# Patient Record
Sex: Male | Born: 2000 | Marital: Single | Smoking: Never smoker
Health system: Southern US, Community
[De-identification: ages and names within clinical notes are randomized; demographics above are authoritative.]

## PROBLEM LIST (undated history)

## (undated) DIAGNOSIS — S060X0A Concussion without loss of consciousness, initial encounter: Secondary | ICD-10-CM

## (undated) DIAGNOSIS — B079 Viral wart, unspecified: Secondary | ICD-10-CM

## (undated) DIAGNOSIS — T7840XA Allergy, unspecified, initial encounter: Secondary | ICD-10-CM

## (undated) HISTORY — PX: WISDOM TOOTH EXTRACTION: SHX21

## (undated) HISTORY — PX: HERNIA REPAIR: SHX51

## (undated) HISTORY — PX: HYDROCELE EXCISION / REPAIR: SUR1145

## (undated) HISTORY — DX: Allergy, unspecified, initial encounter: T78.40XA

## (undated) HISTORY — DX: Viral wart, unspecified: B07.9

## (undated) NOTE — Progress Notes (Signed)
Formatting of this note is different from the original.  Subjective:     Best contact phone number: [phone]     History was provided by the patient and grandmother.  Jason Liu is a 26 y.o. male here for evaluation of  Cough, congestion, sinus pressure, headache, ear pain for a week.     Review of Systems  Pertinent items are noted in HPI      Family History   Problem Relation Age of Onset   ? Hypertension Maternal Grandmother    ? Asthma Maternal Grandmother      History reviewed. No pertinent past medical history.     Past Surgical History:   Procedure Laterality Date   ? Circumcision       Pediatric History   Patient Guardian Status   ? Mother:  Leticia Clas   ? Father:  Sharol Roussel     Other Topics Concern   ? Not on file     Social History Narrative    No religious beliefs that affect medical care.    Speaks english     Objective:     BP 114/70   Temp 98 F (36.7 C) (Tympanic)   Resp (!) 24   Ht 5\' 9"  (1.753 m)   Wt 150 lb 6.4 oz (68.2 kg)   BMI 22.21 kg/m   Gen: Alert, non toxic, and well hydrated.  No signs of acute distress.  Head: Normocephalic.    Eyes: Extraocular movements intact.  Conjunctiva clear.  Ears:  Tympanic membranes clear.  Canals clear  Pharynx: No erythema or tonsillar hypertrophy  Neck: Full range of motion, no meningmus.  No lymphadenopathy  Respiratory:  Lungs clear to auscultation.  No use of accessory muscles.  Cardiovascular: Regular rate and rhythm.  No murmurs noted    Assessment:   Acute bacterial sinusitis     Plan:   No orders of the defined types were placed in this encounter.    See patient instructions for additional plan details.    Amox as directed     I have reviewed the information contained in this note and personally verified its accuracy.  I obtained the history of present illness and personally performed the physical exam   Electronically signed by Jacinto Reap, MD at 10/09/2017  1:03 AM EST

## (undated) NOTE — Telephone Encounter (Signed)
Formatting of this note might be different from the original.  Mychart msg sent. AS, CMA  Electronically signed by Sylvester Harder, CMA at 11/15/2022  9:11 AM EST

---
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
## 2019-07-30 ENCOUNTER — Other Ambulatory Visit: Payer: Self-pay

## 2019-07-30 DIAGNOSIS — Z20822 Contact with and (suspected) exposure to covid-19: Secondary | ICD-10-CM

## 2019-08-01 LAB — NOVEL CORONAVIRUS, NAA: SARS-CoV-2, NAA: NOT DETECTED

## 2021-09-07 ENCOUNTER — Emergency Department (HOSPITAL_COMMUNITY)
Admission: EM | Admit: 2021-09-07 | Discharge: 2021-09-07 | Disposition: A | Discharge status: Home / Self Care | Attending: Student | Admitting: Student

## 2021-09-07 ENCOUNTER — Other Ambulatory Visit: Payer: Self-pay

## 2021-09-07 DIAGNOSIS — Z20822 Contact with and (suspected) exposure to covid-19: Secondary | ICD-10-CM | POA: Diagnosis not present

## 2021-09-07 DIAGNOSIS — B349 Viral infection, unspecified: Secondary | ICD-10-CM | POA: Insufficient documentation

## 2021-09-07 DIAGNOSIS — R059 Cough, unspecified: Secondary | ICD-10-CM | POA: Diagnosis present

## 2021-09-07 LAB — RESP PANEL BY RT-PCR (FLU A&B, COVID) ARPGX2
Influenza A by PCR: POSITIVE — AB
Influenza B by PCR: NEGATIVE
SARS Coronavirus 2 by RT PCR: NEGATIVE

## 2021-09-07 MED ORDER — OSELTAMIVIR PHOSPHATE 75 MG PO CAPS: 75.0000 mg | ORAL_CAPSULE | Freq: Two times a day (BID) | ORAL | 0 refills | Status: AC
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
## 2021-09-07 NOTE — Discharge Instructions (Addendum)
You were seen in the emergency department today for viral illness symptoms.  You state that your sister has the flu.  It is likely that you also have the flu.  I have instructed you to follow-up with your MyChart for results of your swab.  Know that if you do not have the flu you likely have another environmental virus that will follow a similar course as the flu.  You may return to work as long as you have been fever free for 24 hours and feeling improved.  Please return to the emergency department if you begin to have fevers that are unresponsive to medication, shortness of breath or difficulty breathing.

## 2021-09-07 NOTE — ED Triage Notes (Signed)
Pt c/o flu-like symptoms since yesterday. Pt reports fever, vomiting, and body aches.

## 2021-09-07 NOTE — ED Notes (Signed)
Pt discharged from triage without difficulty. °

## 2021-12-17 ENCOUNTER — Inpatient Hospital Stay
Admit: 2021-12-17 | Discharge: 2021-12-17 | Disposition: A | Discharge status: Home / Self Care | Payer: TRICARE (CHAMPUS) | Attending: Emergency Medicine

## 2021-12-17 DIAGNOSIS — K529 Noninfective gastroenteritis and colitis, unspecified: Secondary | ICD-10-CM

## 2021-12-17 LAB — COVID-19 & INFLUENZA COMBO (LIAT HOSPITAL)
INFLUENZA A: NOT DETECTED
INFLUENZA B: NOT DETECTED
SARS-CoV-2: NOT DETECTED

## 2021-12-17 MED ORDER — IBUPROFEN 600 MG PO TABS
600 | ORAL_TABLET | Freq: Four times a day (QID) | ORAL | 0 refills | 15.00000 days | Status: DC | PRN
Start: 2021-12-17 — End: 2021-12-17

## 2021-12-17 MED ORDER — IBUPROFEN 800 MG PO TABS
800 MG | Freq: Once | ORAL | Status: AC
Start: 2021-12-17 — End: 2021-12-17
  Administered 2021-12-17: 14:00:00 800 mg via ORAL

## 2021-12-17 MED ORDER — ONDANSETRON 4 MG PO TBDP
4 MG | ORAL_TABLET | Freq: Three times a day (TID) | ORAL | 0 refills | Status: AC | PRN
Start: 2021-12-17 — End: 2021-12-22

## 2021-12-17 MED ORDER — DICYCLOMINE HCL 10 MG PO CAPS
10 MG | ORAL_CAPSULE | Freq: Four times a day (QID) | ORAL | 0 refills | Status: AC
Start: 2021-12-17 — End: 2021-12-24

## 2021-12-17 MED ORDER — ONDANSETRON 4 MG PO TBDP
4 MG | Freq: Once | ORAL | Status: AC
Start: 2021-12-17 — End: 2021-12-17
  Administered 2021-12-17: 14:00:00 4 mg via ORAL

## 2021-12-17 MED FILL — ONDANSETRON 4 MG PO TBDP: 4 MG | ORAL | Qty: 1

## 2021-12-17 MED FILL — IBUPROFEN 800 MG PO TABS: 800 MG | ORAL | Qty: 1

## 2021-12-17 NOTE — ED Provider Notes (Signed)
[facility] EMERGENCY DEPT  EMERGENCY DEPARTMENT ENCOUNTER      Pt Name: Jim Wood  MRN: [id number]  Birthdate 2001/10/08  Date of evaluation: 12/17/2021  Provider: Brendolyn Patty, MD    CHIEF COMPLAINT       Chief Complaint   Patient presents with    Generalized Body Aches     Body aches, headache, vomiting  since yesterday         HISTORY OF PRESENT ILLNESS    HPI  21 y.o. male presenting with complaint of generalized body aches, headache, vomiting and diarrhea.  Patient arrives with stable vital signs.  He has had the symptoms for the past 24 hours.  He reports that he is otherwise healthy with no major medical issues.  Reports no significant abdominal discomfort.  Denies any other complaints or concerns.      Nursing Notes were reviewed.    REVIEW OF SYSTEMS       Review of Systems   Constitutional:  Negative for chills, fatigue and fever.   HENT:  Negative for congestion and sore throat.    Respiratory:  Negative for cough, shortness of breath and wheezing.    Cardiovascular:  Negative for chest pain and palpitations.   Gastrointestinal:  Positive for diarrhea, nausea and vomiting. Negative for abdominal distention, abdominal pain and constipation.   Genitourinary:  Negative for dysuria and hematuria.   Musculoskeletal:  Negative for joint swelling and myalgias.   Skin:  Negative for rash and wound.   Neurological:  Negative for dizziness and headaches.   Psychiatric/Behavioral:  Negative for agitation. The patient is not nervous/anxious.    All other systems reviewed and are negative.    Except as noted above the remainder of the review of systems was reviewed and negative.       PAST MEDICAL HISTORY   No past medical history on file.    SURGICAL HISTORY     No past surgical history on file.    CURRENT MEDICATIONS       Previous Medications    No medications on file       ALLERGIES     Patient has no known allergies.    FAMILY HISTORY     No family history on file.     SOCIAL HISTORY       Social History      Socioeconomic History    Marital status: Single       SCREENINGS         Glasgow Coma Scale  Eye Opening: Spontaneous  Best Motor Response: Obeys commands                     CIWA Assessment  BP: 122/75  Heart Rate: 98                 PHYSICAL EXAM    (up to 7 for level 4, 8 or more for level 5)     ED Triage Vitals [12/17/21 0837]   BP Temp Temp Source Heart Rate Resp SpO2 Height Weight   122/75 98.8 ??F (37.1 ??C) Oral 98 15 98 % 5\' 10"  (1.778 m) 185 lb (83.9 kg)       Physical Exam  Vitals and nursing note reviewed.   Constitutional:       Appearance: Normal appearance.   HENT:      Head: Normocephalic and atraumatic.      Nose: Nose normal.      Mouth/Throat:  Mouth: Mucous membranes are moist.      Pharynx: Oropharynx is clear.   Eyes:      Conjunctiva/sclera: Conjunctivae normal.      Pupils: Pupils are equal, round, and reactive to light.   Cardiovascular:      Rate and Rhythm: Normal rate and regular rhythm.   Pulmonary:      Effort: Pulmonary effort is normal.      Breath sounds: Normal breath sounds.   Musculoskeletal:         General: No swelling or deformity. Normal range of motion.      Cervical back: Normal range of motion and neck supple.   Skin:     General: Skin is warm and dry.   Neurological:      General: No focal deficit present.      Mental Status: He is alert. Mental status is at baseline.      Motor: No weakness.   Psychiatric:         Mood and Affect: Mood normal.         Behavior: Behavior normal.       DIAGNOSTIC RESULTS   PROCEDURES:  Unless otherwise noted below, none     Procedures    EKG: All EKG's are interpreted by the Emergency Department Physician who either signs or Co-signs this chart in the absence of a cardiologist.      RADIOLOGY:   Non-plain film images such as CT, Ultrasound and MRI are read by the radiologist. Plain radiographic images are visualized and preliminarily interpreted by the emergency physician with the below findings:    Interpretation per the Radiologist  below, if available at the time of this note:    No orders to display       ED BEDSIDE ULTRASOUND:   Performed by ED Physician - none    LABS:  Labs Reviewed   COVID-19 & INFLUENZA COMBO [facility])    Narrative:     Is this test for diagnosis or screening?->Diagnosis of ill patient  Symptomatic for COVID-19 as defined by CDC?->Unknown  Date of Symptom Onset->N/A  Hospitalized for COVID-19?->No  Admitted to ICU for COVID-19?->No  Pregnant:->No       All other labs were within normal range or not returned as of this dictation.    EMERGENCY DEPARTMENT COURSE/REASSESSMENT and MDM:   Vitals:    Vitals:    12/17/21 0837   BP: 122/75   Pulse: 98   Resp: 15   Temp: 98.8 ??F (37.1 ??C)   TempSrc: Oral   SpO2: 98%   Weight: 83.9 kg   Height: 5\' 10"  (1.778 m)       ED Course:    ED Course as of 12/17/21 1022   Fri Dec 17, 2021   1014 Patient is negative for flu and COVID.  His vital signs are stable.  He can tolerate p.o. therapy I will provide symptomatic treatment for home for I believe there is a viral enteritis.  I did asked that he return for any worsening symptoms or if his pain begins to localize to any particular quadrant of his abdomen.  Patient was agreeable with this and otherwise comfortable with discharge home. [BB]      ED Course User Index  [BB] Dec 19, 2021, MD       MDM     Amount and/or Complexity of Data Reviewed  Clinical lab tests: ordered and reviewed  Decide to obtain previous medical records or to obtain history  from someone other than the patient: yes  Review and summarize past medical records: yes  Independent visualization of images, tracings, or specimens: yes    Risk of Complications, Morbidity, and/or Mortality  Presenting problems: low  Diagnostic procedures: low  Management options: low    Patient Progress  Patient progress: improved      CONSULTS:  None    FINAL IMPRESSION      1. Enteritis          DISPOSITION/PLAN   DISPOSITION Decision To Discharge 12/17/2021 08:43:08  AM      PATIENT REFERRED TO:  [facility]  Please return to the emergency room with any acute questions, concerns, worsening status or any delay to your outpatient care.       You may also call [phone] for referral to physicians in your area.  Call today      DISCHARGE MEDICATIONS:  New Prescriptions    DICYCLOMINE (BENTYL) 10 MG CAPSULE    Take 1 capsule by mouth 4 times daily for 7 days    ONDANSETRON (ZOFRAN-ODT) 4 MG DISINTEGRATING TABLET    Take 1 tablet by mouth every 8 hours as needed for Nausea or Vomiting     Controlled Substances Monitoring:     No flowsheet data found.    (Please note that portions of this note were completed with a voice recognition program.  Efforts were made to edit the dictations but occasionally words are mis-transcribed.)    Brendolyn Patty, MD (electronically signed)  Attending Emergency Physician           Brendolyn Patty, MD  12/17/21 1022

## 2021-12-17 NOTE — Discharge Instructions (Addendum)
Please return to the emergency room with any acute questions, concerns, worsening status or any delay to your outpatient care.    Thank you so much for visiting with us today.  You have had a screening emergency medical exam and to this point, no emergent condition has been identified.  It is important to realize that of course your visit today is simply a moment in time and that your medical condition can certainly change and become worse, necessitating re-evaluation in an urgent or emergent manner.  It is your responsibility to seek care in such instances.  It is also your responsibility to follow up with your primary care provider to discuss your Emergency Department visit and to go over any and all testing that was incurred during your Emergency Department visit.   We have made you aware of any pertinent results at this visit, however this may not have been comprehensive.  Lack of an acute emergency condition does not mean that there is not a problem, nor does it replace a thorough exam performed by a primary care physician.  It is your responsibility to follow your discharge instructions as given, to follow up with any other physicians as indicated, and to return to the Emergency Department as instructed.  Thank you again for visiting with us today, please let us know if you have any further questions.     You may also call [phone] for referral to physicians in your area.

## 2022-07-04 ENCOUNTER — Emergency Department: Admit: 2022-07-05 | Payer: TRICARE (CHAMPUS)

## 2022-07-04 DIAGNOSIS — S161XXA Strain of muscle, fascia and tendon at neck level, initial encounter: Secondary | ICD-10-CM
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
## 2022-07-05 ENCOUNTER — Inpatient Hospital Stay
Admit: 2022-07-05 | Discharge: 2022-07-05 | Disposition: A | Discharge status: Home / Self Care | Payer: TRICARE (CHAMPUS)

## 2022-07-05 MED ORDER — NAPROXEN SODIUM 550 MG PO TABS
550 MG | ORAL_TABLET | Freq: Two times a day (BID) | ORAL | 0 refills | Status: AC
Start: 2022-07-05 — End: 2022-07-11

## 2022-07-05 MED ORDER — METHOCARBAMOL 500 MG PO TABS
500 MG | ORAL_TABLET | Freq: Three times a day (TID) | ORAL | 0 refills | Status: AC
Start: 2022-07-05 — End: 2022-07-09

## 2022-07-07 NOTE — Telephone Encounter (Signed)
ED FOLLOW UP PROJECT -Patient seen in ER on 07/04/22 and is followed up the [place] for his care

## 2022-09-16 DIAGNOSIS — Z419 Encounter for procedure for purposes other than remedying health state, unspecified: Secondary | ICD-10-CM | POA: Diagnosis not present

## 2022-10-17 DIAGNOSIS — Z419 Encounter for procedure for purposes other than remedying health state, unspecified: Secondary | ICD-10-CM | POA: Diagnosis not present

## 2022-11-15 ENCOUNTER — Telehealth: Payer: Self-pay

## 2022-11-15 NOTE — Telephone Encounter (Signed)
Mychart msg sent. AS, CMA 

## 2022-11-17 DIAGNOSIS — Z419 Encounter for procedure for purposes other than remedying health state, unspecified: Secondary | ICD-10-CM | POA: Diagnosis not present

## 2022-12-16 DIAGNOSIS — Z419 Encounter for procedure for purposes other than remedying health state, unspecified: Secondary | ICD-10-CM | POA: Diagnosis not present

## 2023-01-16 DIAGNOSIS — Z419 Encounter for procedure for purposes other than remedying health state, unspecified: Secondary | ICD-10-CM | POA: Diagnosis not present

## 2023-02-15 DIAGNOSIS — Z419 Encounter for procedure for purposes other than remedying health state, unspecified: Secondary | ICD-10-CM | POA: Diagnosis not present

## 2023-03-18 DIAGNOSIS — Z419 Encounter for procedure for purposes other than remedying health state, unspecified: Secondary | ICD-10-CM | POA: Diagnosis not present

## 2023-03-23 ENCOUNTER — Emergency Department
Admission: EM | Admit: 2023-03-23 | Discharge: 2023-03-24 | Disposition: A | Discharge status: Home / Self Care | Payer: TRICARE Prime—HMO | Attending: Internal Medicine | Admitting: Internal Medicine

## 2023-03-23 DIAGNOSIS — R1110 Vomiting, unspecified: Secondary | ICD-10-CM | POA: Insufficient documentation

## 2023-03-23 DIAGNOSIS — R197 Diarrhea, unspecified: Secondary | ICD-10-CM | POA: Insufficient documentation

## 2023-03-23 DIAGNOSIS — R109 Unspecified abdominal pain: Secondary | ICD-10-CM

## 2023-03-23 LAB — CBC AND DIFFERENTIAL
Absolute NRBC: 0 10*3/uL (ref 0.00–0.00)
Basophils Absolute Automated: 0.04 10*3/uL (ref 0.00–0.08)
Basophils Automated: 0.7 %
Eosinophils Absolute Automated: 0.25 10*3/uL (ref 0.00–0.44)
Eosinophils Automated: 4.6 %
Hematocrit: 47.2 % (ref 37.6–49.6)
Hgb: 15.9 g/dL (ref 12.5–17.1)
Immature Granulocytes Absolute: 0.01 10*3/uL (ref 0.00–0.07)
Immature Granulocytes: 0.2 %
Instrument Absolute Neutrophil Count: 3.03 10*3/uL (ref 1.10–6.33)
Lymphocytes Absolute Automated: 1.77 10*3/uL (ref 0.42–3.22)
Lymphocytes Automated: 32.4 %
MCH: 29.3 pg (ref 25.1–33.5)
MCHC: 33.7 g/dL (ref 31.5–35.8)
MCV: 87.1 fL (ref 78.0–96.0)
MPV: 9.6 fL (ref 8.9–12.5)
Monocytes Absolute Automated: 0.36 10*3/uL (ref 0.21–0.85)
Monocytes: 6.6 %
Neutrophils Absolute: 3.03 10*3/uL (ref 1.10–6.33)
Neutrophils: 55.5 %
Nucleated RBC: 0 /100 WBC (ref 0.0–0.0)
Platelets: 225 10*3/uL (ref 142–346)
RBC: 5.42 10*6/uL (ref 4.20–5.90)
RDW: 12 % (ref 11–15)
WBC: 5.46 10*3/uL (ref 3.10–9.50)

## 2023-03-23 LAB — LIPASE: Lipase: 24 U/L (ref 8–78)

## 2023-03-23 LAB — URINALYSIS WITH REFLEX TO MICROSCOPIC EXAM - REFLEX TO CULTURE
Bilirubin, UA: NEGATIVE
Blood, UA: NEGATIVE
Glucose, UA: NEGATIVE
Ketones UA: NEGATIVE
Leukocyte Esterase, UA: NEGATIVE
Nitrite, UA: NEGATIVE
Specific Gravity UA: 1.023 (ref 1.001–1.035)
Urine pH: 6.5 (ref 5.0–8.0)
Urobilinogen, UA: NORMAL mg/dL (ref 0.2–2.0)

## 2023-03-23 LAB — COMPREHENSIVE METABOLIC PANEL
ALT: 92 U/L — ABNORMAL HIGH (ref 0–55)
AST (SGOT): 41 U/L (ref 5–41)
Albumin/Globulin Ratio: 1.6 (ref 0.9–2.2)
Albumin: 4.4 g/dL (ref 3.5–5.0)
Alkaline Phosphatase: 73 U/L (ref 37–117)
Anion Gap: 7 (ref 5.0–15.0)
BUN: 9 mg/dL (ref 9.0–28.0)
Bilirubin, Total: 0.8 mg/dL (ref 0.2–1.2)
CO2: 29 mEq/L (ref 17–29)
Calcium: 9.3 mg/dL (ref 8.5–10.5)
Chloride: 104 mEq/L (ref 99–111)
Creatinine: 1.2 mg/dL (ref 0.5–1.5)
Globulin: 2.7 g/dL (ref 2.0–3.6)
Glucose: 87 mg/dL (ref 70–100)
Potassium: 4.3 mEq/L (ref 3.5–5.3)
Protein, Total: 7.1 g/dL (ref 6.0–8.3)
Sodium: 140 mEq/L (ref 135–145)
eGFR: >60 mL/min/{1.73_m2} (ref >=60)

## 2023-03-23 MED ORDER — SODIUM CHLORIDE 0.9 % IV BOLUS
1000.0000 mL | Freq: Once | INTRAVENOUS | Status: AC
Start: 2023-03-23 — End: 2023-03-24
  Administered 2023-03-23: 1000 mL via INTRAVENOUS

## 2023-03-23 MED ORDER — FAMOTIDINE 10 MG/ML IV SOLN (WRAP)
20.0000 mg | Freq: Once | INTRAVENOUS | Status: AC
Start: 2023-03-23 — End: 2023-03-23
  Administered 2023-03-23: 20 mg via INTRAVENOUS
  Filled 2023-03-23: qty 2

## 2023-03-23 MED ORDER — ONDANSETRON HCL 4 MG/2ML IJ SOLN
4.0000 mg | Freq: Once | INTRAMUSCULAR | Status: AC
Start: 2023-03-23 — End: 2023-03-23
  Administered 2023-03-23: 4 mg via INTRAVENOUS
  Filled 2023-03-23: qty 2

## 2023-03-23 NOTE — ED Triage Notes (Signed)
[facility]PART[facility]  Provider in Triage Note        Patient Name: Jason Liu    Chief Complaint:   Chief Complaint   Patient presents with    Dizziness    Emesis    Diarrhea       HPI: Jason Liu is a 22 y.o. male, who has had a rapid medical screening evaluation initiated by myself. Presents to the ED c/o abdominal pain, nausea, vomiting, diarrhea that began at 2 PM today. He states he is in the [employer] and there is a stomach bug going around, and there are other coworkers with similar symptoms. Denies fever, chills, cp, sob.     States feels like he is not able to stay hydrated, cannot keep down fluids    Medical/Surgical/Social history: as per HPI    Vitals: BP 135/66   Pulse 65   Temp 97.9 F (36.6 C) (Oral)   Resp 20   Wt 93 kg   SpO2 100%     Pertinent brief exam:   Examination of area of concern: abdomen soft, nondistended    Preliminary orders: labs    Symptom based preliminary diagnosis/MDM: abdominal pain, n/v/d    Patient advised to remain in the ED until further evaluation can be performed. Patient instructed to notify staff of any changes in condition while waiting.  This assessment is an initial evaluation to expedite care.

## 2023-03-23 NOTE — ED Triage Notes (Signed)
Pt with c/o lightheadedness that began today. Pt states N/V/D, chills, HA. Pt states seeing spots of blood in emesis, periumbilical pain. Pt denies fevers, CP, SOB, visual changes. Pt denies being on blood thinners. Pt states taking Ibuprofen at times. Pt states pain is 6/10. Pt denies taking any medication prior to arrival.

## 2023-03-23 NOTE — ED Provider Notes (Signed)
EMERGENCY DEPARTMENT HISTORY AND PHYSICAL EXAM    Date: 03/23/2023  Patient Name: Jason Liu    History of Presenting Illness     Chief Complaint   Patient presents with    Dizziness    Emesis    Diarrhea       History Provided By: pt    Chief Complaint:   Onset:   Timing:   Location:   Quality:   Severity:   Modifying Factors:   Associated Symptoms:     Additional History: Jason Liu is a 22 y.o. male who says he woke up from sleeping this afternoon (he works nights) and had nausea and vomiting about 7-8 times (with a few specs of blood) and two episodes of watery non-bloody diarrhea. Yesterday he had a headache, sore throat, myalgias. He does not think he has had a fever. He denies chest pain or shortness of breath. He is concerned about being dehydrated. He feels minimal cental abdominal discomfort.     PCP: Pcp, None, MD      No current facility-administered medications for this encounter.     Current Outpatient Medications   Medication Sig Dispense Refill    ondansetron (ZOFRAN-ODT) 4 MG disintegrating tablet Take 1 tablet (4 mg) by mouth every 6 (six) hours as needed for Nausea 8 tablet 0       Past History     Past Medical History:  History reviewed. No pertinent past medical history.    Past Surgical History:  Past Surgical History:   Procedure Laterality Date    WISDOM TOOTH EXTRACTION         Family History:  History reviewed. No pertinent family history.    Social History:  Social History     Tobacco Use    Smoking status: Never    Smokeless tobacco: Never   Vaping Use    Vaping status: Every Day   Substance Use Topics    Alcohol use: Yes    Drug use: Never       Allergies:  No Known Allergies    Review of Systems   Review of Systems     Physical Exam   BP 118/65   Pulse 64   Temp 97.9 F (36.6 C) (Oral)   Resp 16   Wt 93 kg   SpO2 99%   Physical Exam  Vitals reviewed.   Constitutional:       Appearance: He is not toxic-appearing or diaphoretic.   HENT:      Head: Normocephalic and  atraumatic.      Nose: No congestion or rhinorrhea.   Eyes:      Conjunctiva/sclera: Conjunctivae normal.   Cardiovascular:      Rate and Rhythm: Normal rate and regular rhythm.   Pulmonary:      Effort: Pulmonary effort is normal.      Breath sounds: Normal breath sounds.   Abdominal:      General: Bowel sounds are normal.      Palpations: Abdomen is soft.      Tenderness: There is no abdominal tenderness.   Musculoskeletal:      Cervical back: Neck supple.      Right lower leg: No edema.      Left lower leg: No edema.   Skin:     General: Skin is warm and dry.   Neurological:      Mental Status: He is alert and oriented to person, place, and time.   Psychiatric:  Mood and Affect: Mood normal.         Behavior: Behavior normal.         Thought Content: Thought content normal.         Judgment: Judgment normal.         Diagnostic Study Results     Labs -     Results       Procedure Component Value Units Date/Time    COVID-19 (SARS-CoV-2) and Influenza A/B, NAA ([facility]) [[id number]] Collected: 03/23/23 2335    Specimen: Nasopharyngeal Updated: 03/24/23 0008     Purpose of COVID testing Diagnostic -[facility]     SARS-CoV-2 Specimen Source Nasal Swab     SARS-CoV-2 Overall Result Not Detected     Influenza A Not Detected     Influenza B Not Detected    Narrative:      o Collect and clearly label specimen type:  o PREFERRED-Upper respiratory specimen: One Nasal Swab in  Transport Media.  o Hand deliver to laboratory ASAP  Diagnostic -[facility]I    Urinalysis Reflex to Microscopic Exam- Reflex to Culture [[id number]]  (Abnormal) Collected: 03/23/23 2335    Specimen: Urine, Clean Catch Updated: 03/23/23 2354     Urine Type Urine, Clean Ca     Color, UA Yellow     Clarity, UA Clear     Specific Gravity UA 1.023     Urine pH 6.5     Leukocyte Esterase, UA Negative     Nitrite, UA Negative     Protein, UR 10= Trace     Glucose, UA Negative     Ketones UA Negative     Urobilinogen, UA Normal mg/dL      Bilirubin, UA Negative      Blood, UA Negative     RBC, UA 0-2 /hpf      WBC, UA 0-5 /hpf      Squamous Epithelial Cells, Urine 0-5 /hpf      Urine Mucus Present    Comprehensive metabolic panel [[id number]]  (Abnormal) Collected: 03/23/23 2022    Specimen: Blood Updated: 03/23/23 2107     Glucose 87 mg/dL      BUN 9.0 mg/dL      Creatinine 1.2 mg/dL      Sodium 086 mEq/L      Potassium 4.3 mEq/L      Chloride 104 mEq/L      CO2 29 mEq/L      Calcium 9.3 mg/dL      Protein, Total 7.1 g/dL      Albumin 4.4 g/dL      AST (SGOT) 41 U/L      ALT 92 U/L      Alkaline Phosphatase 73 U/L      Bilirubin, Total 0.8 mg/dL      Globulin 2.7 g/dL      Albumin/Globulin Ratio 1.6     Anion Gap 7.0     eGFR >60.0 mL/min/1.73 m2     Lipase [[id number]] Collected: 03/23/23 2022    Specimen: Blood Updated: 03/23/23 2107     Lipase 24 U/L     CBC and differential [[id number]] Collected: 03/23/23 2022    Specimen: Blood Updated: 03/23/23 2052     WBC 5.46 x10 3/uL      Hgb 15.9 g/dL      Hematocrit 01.0 %      Platelets 225 x10 3/uL      RBC 5.42 x10 6/uL      MCV 87.1 fL  MCH 29.3 pg      MCHC 33.7 g/dL      RDW 12 %      MPV 9.6 fL      Instrument Absolute Neutrophil Count 3.03 x10 3/uL      Neutrophils 55.5 %      Lymphocytes Automated 32.4 %      Monocytes 6.6 %      Eosinophils Automated 4.6 %      Basophils Automated 0.7 %      Immature Granulocytes 0.2 %      Nucleated RBC 0.0 /100 WBC      Neutrophils Absolute 3.03 x10 3/uL      Lymphocytes Absolute Automated 1.77 x10 3/uL      Monocytes Absolute Automated 0.36 x10 3/uL      Eosinophils Absolute Automated 0.25 x10 3/uL      Basophils Absolute Automated 0.04 x10 3/uL      Immature Granulocytes Absolute 0.01 x10 3/uL      Absolute NRBC 0.00 x10 3/uL             Radiologic Studies -   Radiology Results (24 Hour)       ** No results found for the last 24 hours. **        .      Medical Decision Making   I am the first provider for this patient.    Vital Signs-Reviewed the patient's vital signs.     Pulse  Oximetry Analysis - Normal 100 % on ra    ED Course: Medical Decision Making  Amount and/or Complexity of Data Reviewed  External Data Reviewed: notes.  Labs: ordered. Decision-making details documented in ED Course.     Details: Cbc is normal, alt 92, rest of comp chem is norma except for alt 92l, lipase is normal, ua is negative for infection , negative covid/flu     Risk  OTC drugs.  Prescription drug management.  Decision regarding hospitalization.  Risk Details: Pt was given iv pepcid and zofran and normal saline. Pt felt much better and ready for discharge. Abdomen is soft and non-tender. Rted precautions given.          Diagnosis     Clinical Impression:   1. Abdominal pain, vomiting, and diarrhea        _______________________________    Attestations:  This note is prepared by Avanell Shackleton, MD.     Avanell Shackleton, MD.  I confirm that the note above accurately reflects all work, treatment, procedures, and medical decision making performed by me.    _______________________________         Azzie Glatter, MD  03/28/23 [phone]

## 2023-03-24 LAB — COVID-19 (SARS-COV-2) & INFLUENZA  A/B, NAA (ROCHE LIAT)
Influenza A: NOT DETECTED
Influenza B: NOT DETECTED
SARS-CoV-2 Overall Result: NOT DETECTED

## 2023-03-24 MED ORDER — ONDANSETRON 4 MG PO TBDP
4.0000 mg | ORAL_TABLET | Freq: Four times a day (QID) | ORAL | 0 refills | Status: AC | PRN
Start: 2023-03-24 — End: ?

## 2023-03-24 NOTE — Discharge Instructions (Addendum)
Follow up with your doctor

## 2023-04-17 DIAGNOSIS — Z419 Encounter for procedure for purposes other than remedying health state, unspecified: Secondary | ICD-10-CM | POA: Diagnosis not present

## 2023-05-18 DIAGNOSIS — Z419 Encounter for procedure for purposes other than remedying health state, unspecified: Secondary | ICD-10-CM | POA: Diagnosis not present

## 2023-06-18 DIAGNOSIS — Z419 Encounter for procedure for purposes other than remedying health state, unspecified: Secondary | ICD-10-CM | POA: Diagnosis not present

## 2023-07-18 DIAGNOSIS — Z419 Encounter for procedure for purposes other than remedying health state, unspecified: Secondary | ICD-10-CM | POA: Diagnosis not present

## 2023-08-18 DIAGNOSIS — Z419 Encounter for procedure for purposes other than remedying health state, unspecified: Secondary | ICD-10-CM | POA: Diagnosis not present

## 2023-09-17 DIAGNOSIS — Z419 Encounter for procedure for purposes other than remedying health state, unspecified: Secondary | ICD-10-CM | POA: Diagnosis not present

## 2023-10-18 DIAGNOSIS — Z419 Encounter for procedure for purposes other than remedying health state, unspecified: Secondary | ICD-10-CM | POA: Diagnosis not present

## 2023-11-18 DIAGNOSIS — Z419 Encounter for procedure for purposes other than remedying health state, unspecified: Secondary | ICD-10-CM | POA: Diagnosis not present

## 2023-12-16 DIAGNOSIS — Z419 Encounter for procedure for purposes other than remedying health state, unspecified: Secondary | ICD-10-CM | POA: Diagnosis not present

## 2024-01-27 DIAGNOSIS — Z419 Encounter for procedure for purposes other than remedying health state, unspecified: Secondary | ICD-10-CM | POA: Diagnosis not present

## 2024-02-26 DIAGNOSIS — Z419 Encounter for procedure for purposes other than remedying health state, unspecified: Secondary | ICD-10-CM | POA: Diagnosis not present

## 2024-03-28 DIAGNOSIS — Z419 Encounter for procedure for purposes other than remedying health state, unspecified: Secondary | ICD-10-CM | POA: Diagnosis not present

## 2024-04-23 DIAGNOSIS — R197 Diarrhea, unspecified: Principal | ICD-10-CM

## 2024-04-23 DIAGNOSIS — R112 Nausea with vomiting, unspecified: Principal | ICD-10-CM

## 2024-04-23 NOTE — Discharge Instructions (Signed)
 You have been evaluated in the Emergency Department today for nausea and vomiting. Your evaluation suggests that your symptoms are most likely due to viral illness which will improve on its own with rest and fluids. Remember to drink plenty of fluids at home.  Please follow up with your primary care physician within two days.  Return to the Emergency Department if you experience worsening or uncontrolled pain, inability to tolerate fluids by mouth, difficulty breathing, fevers 104F or greater, recurrent vomiting, or any other concerning symptoms.  Thank you for choosing [place] for your care.

## 2024-04-23 NOTE — ED Provider Notes (Signed)
 [facility] EMERGENCY DEPARTMENT  EMERGENCY DEPARTMENT ENCOUNTER      Pt Name: Jim Wood  MRN: [id number]  Birthdate 2001-03-30  Date of evaluation: 04/23/2024  Provider: Victory Lovings, MD    CHIEF COMPLAINT       Chief Complaint   Patient presents with    Abdominal Pain     Pt to ED with diarrhea, nausea, vomiting and lightheadedness since yesterday. Pt has pain starting central upper radiating to the left.          HISTORY OF PRESENT ILLNESS    Jim Wood is a 23 y.o. male who presents with nausea vomiting and diarrhea.  Patient reports that started yesterday.  Patient reports some lightheadedness.  Thinks he is dehydrated.  Denies any recent travel or antibiotic use.  Denies any fever or chills.  Reports some mid abdominal pain.      Nursing Notes were reviewed.    REVIEW OF SYSTEMS       Review of Systems   Constitutional:  Negative for activity change, appetite change, chills and fever.   HENT:  Negative for congestion, facial swelling, rhinorrhea and sore throat.    Eyes:  Negative for pain and redness.   Respiratory:  Negative for shortness of breath and wheezing.    Cardiovascular:  Negative for chest pain.   Gastrointestinal:  Positive for abdominal pain, diarrhea, nausea and vomiting.   Genitourinary:  Negative for dysuria and hematuria.   Musculoskeletal:  Negative for neck pain and neck stiffness.   Skin:  Negative for color change and wound.   Allergic/Immunologic: Negative for immunocompromised state.   Neurological:  Negative for facial asymmetry and headaches.   Hematological:  Does not bruise/bleed easily.       Except as noted above the remainder of the review of systems was reviewed and negative.       PAST MEDICAL HISTORY   No past medical history on file.    SURGICAL HISTORY     No past surgical history on file.    CURRENT MEDICATIONS       Discharge Medication List as of 04/23/2024 10:25 PM        CONTINUE these medications which have NOT CHANGED    Details    naproxen  sodium (ANAPROX  DS) 550 MG tablet Take 1 tablet by mouth 2 times daily (with meals) for 7 days, Disp-14 tablet, R-0Print      dicyclomine  (BENTYL ) 10 MG capsule Take 1 capsule by mouth 4 times daily for 7 days, Disp-28 capsule, R-0Print             ALLERGIES     Patient has no known allergies.    FAMILY HISTORY     No family history on file.     SOCIAL HISTORY       Social History     Socioeconomic History    Marital status: Single       SCREENINGS         Glasgow Coma Scale  Eye Opening: Spontaneous  Best Verbal Response: Oriented  Best Motor Response: Obeys commands  Glasgow Coma Scale Score: 15                     CIWA Assessment  BP: 117/73  Pulse: 77                 PHYSICAL EXAM    (up to 7 for level 4, 8 or more for level 5)  ED Triage Vitals [04/23/24 2028]   BP Systolic BP Percentile Diastolic BP Percentile Temp Temp Source Pulse Respirations SpO2   123/73 -- -- 98.2 F (36.8 C) Oral 83 16 98 %      Height Weight - Scale         1.753 m (5' 9) 93 kg (205 lb)             Physical Exam  Vitals and nursing note reviewed.   Constitutional:       General: He is not in acute distress.     Appearance: Normal appearance. He is normal weight. He is not toxic-appearing.   HENT:      Head: Normocephalic and atraumatic.      Right Ear: External ear normal.      Left Ear: External ear normal.      Nose: Nose normal.   Eyes:      Extraocular Movements: Extraocular movements intact.      Pupils: Pupils are equal, round, and reactive to light.   Pulmonary:      Effort: Pulmonary effort is normal. No respiratory distress.   Abdominal:      General: Abdomen is flat. There is no distension.      Palpations: Abdomen is soft.      Tenderness: There is no guarding.      Comments: Mild mid abdominal discomfort to palpation, no guarding/rebound, abd is soft and otherwise nontender   Musculoskeletal:         General: Normal range of motion.      Cervical back: Normal range of motion and neck supple.   Neurological:       General: No focal deficit present.      Mental Status: He is alert and oriented to person, place, and time. Mental status is at baseline.   Psychiatric:         Mood and Affect: Mood normal.         Behavior: Behavior normal.         Thought Content: Thought content normal.         Judgment: Judgment normal.         DIAGNOSTIC RESULTS       PROCEDURES:  Unless otherwise noted below, none     Procedures    EKG: All EKG's are interpreted by the Emergency Department Physician who either signs or Co-signs this chart in the absence of a cardiologist.    LABS:  Labs Reviewed   CBC WITH AUTO DIFFERENTIAL   COMPREHENSIVE METABOLIC PANEL   LIPASE       All other labs were within normal range or not returned as of this dictation.    RADIOLOGY:   Non-plain film images such as CT, Ultrasound and MRI are read by the radiologist. Plain radiographic images are visualized and preliminarily interpreted by the emergency physician with the below findings:    Interpretation per the Radiologist below, if available at the time of this note:    No orders to display         EMERGENCY DEPARTMENT COURSE/REASSESSMENT and MDM:   Vitals:    Vitals:    04/23/24 2028 04/23/24 2226   BP: 123/73 117/73   Pulse: 83 77   Resp: 16 18   Temp: 98.2 F (36.8 C) 98 F (36.7 C)   TempSrc: Oral    SpO2: 98% 99%   Weight: 93 kg (205 lb)    Height: 1.753 m (5' 9)  Medical Decision Making  Amount and/or Complexity of Data Reviewed  Labs: ordered. Decision-making details documented in ED Course.    Risk  Prescription drug management.        ED Course:     23 year old male who presents with nausea vomiting diarrhea.    Patient was stable vital signs.  On exam patient generally well-appearing no acute distress.  Reassuring physical exam including abdominal exam.  This patient presents with nausea, vomiting & diarrhea. Differential diagnoses includes possible acute gastroenteritis. Abdominal exam without peritoneal signs. Currently euvolemic  without evidence of dehydration. No evidence of surgical abdomen or other acute medical emergency including bowel obstruction, viscus perforation, vascular catastrophe, atypical appendicitis, acute cholecystitis at this time. Presentation not consistent with other acute, emergent causes of vomiting / diarrhea at this time. No indication for abdominal imaging.  Labs overall very reassuring normal CBC CMP lipase.  Patient feeling better after IV fluids and Zofran .  No indication for further workup or admission.  Patient given Rx for Zofran  and instructions on supportive care at home strict return precautions.  Patient discharged in stable condition    CONSULTS:  None    FINAL IMPRESSION      1. Nausea vomiting and diarrhea          DISPOSITION/PLAN   DISPOSITION Decision To Discharge 04/23/2024 10:24:33 PM   DISPOSITION CONDITION Stable             PATIENT REFERRED TO:  Florie Cassis. [facility] Emergency Department  [address]  [place]  [address]  [phone]  Go to   As needed, If symptoms worsen      DISCHARGE MEDICATIONS:  Discharge Medication List as of 04/23/2024 10:25 PM        START taking these medications    Details   ondansetron  (ZOFRAN -ODT) 4 MG disintegrating tablet Take 1 tablet by mouth every 8 hours as needed for Nausea or Vomiting, Disp-15 tablet, R-0Print             Controlled Substances Monitoring:          No data to display                (Please note that portions of this note were completed with a voice recognition program. Efforts were made to edit the dictations but occasionally words are mis-transcribed. This encounter may have been completed utilizing Direct Speech Voice Recognition Software. Grammatical errors, random word insertions, pronoun errors, and incomplete sentences are occasional consequences of the system due to software limitation, ambient noise, and hardware issues. These may be missed by proof reading prior to affixing electronic signature. Any  questions or concerns about the content, text, or information contained within the body of this dictation should be directly addressed to the physician for clarification. If you have any questions or concerns please do not hesitate to contact me. )    Victory Lovings, MD (electronically signed)  Attending Emergency Physician           Lovings Victory, MD  04/23/24 [phone]

## 2024-04-24 ENCOUNTER — Inpatient Hospital Stay
Admit: 2024-04-24 | Discharge: 2024-04-24 | Disposition: A | Discharge status: Home / Self Care | Payer: TRICARE (CHAMPUS) | Arrived: WI | Attending: Emergency Medicine

## 2024-04-24 LAB — LIPASE: Lipase: 54 U/L (ref 13–60)

## 2024-04-24 LAB — COMPREHENSIVE METABOLIC PANEL
ALT: 30 U/L (ref 0–42)
AST: 25 U/L (ref 0–46)
Albumin/Globulin Ratio: 1.55 (ref 1.00–2.70)
Albumin: 4.5 g/dL (ref 3.5–5.2)
Alk Phosphatase: 78 U/L (ref 40–130)
Anion Gap: 11 mmol/L (ref 2–17)
BUN: 14 mg/dL (ref 6–20)
CALCIUM,CORRECTED,CCA: 9.1 mg/dL (ref 8.5–10.7)
CO2: 26 mmol/L (ref 22–29)
Calcium: 9.5 mg/dL (ref 8.5–10.7)
Chloride: 103 mmol/L (ref 98–107)
Creatinine: 1.1 mg/dL (ref 0.7–1.3)
Est, Glom Filt Rate: 96 mL/min/1.73mÂ² (ref >=60)
Globulin: 2.9 g/dL (ref 1.9–4.4)
Glucose: 89 mg/dL (ref 70–99)
Osmolaliy Calculated: 279 mosm/kg (ref 270–287)
Potassium: 3.9 mmol/L (ref 3.5–5.3)
Sodium: 140 mmol/L (ref 135–145)
Total Bilirubin: 0.41 mg/dL (ref 0.00–1.20)
Total Protein: 7.4 g/dL (ref 5.7–8.3)

## 2024-04-24 LAB — CBC WITH AUTO DIFFERENTIAL
Basophils %: 0.6 % (ref 0.0–2.0)
Basophils Absolute: 0 x10e3/mcL (ref 0.0–0.2)
Eosinophils %: 2.9 % (ref 0.0–7.0)
Eosinophils Absolute: 0.2 x10e3/mcL (ref 0.0–0.5)
Hematocrit: 42 % (ref 38.0–52.0)
Hemoglobin: 14.6 g/dL (ref 13.0–17.3)
Immature Grans (Abs): 0.01 x10e3/mcL (ref 0.00–0.06)
Immature Granulocytes %: 0.2 % (ref 0.0–0.6)
Lymphocytes Absolute: 2.2 x10e3/mcL (ref 1.0–3.2)
Lymphocytes: 33.5 % (ref 15.0–45.0)
MCH: 30.1 pg (ref 27.0–34.5)
MCHC: 34.8 g/dL (ref 30.0–36.0)
MCV: 86.6 fL (ref 84.0–100.0)
MPV: 9.5 fL (ref 7.0–12.2)
Monocytes %: 6.6 % (ref 4.0–12.0)
Monocytes Absolute: 0.4 x10e3/mcL (ref 0.3–1.0)
Neutrophils %: 56.2 % (ref 42.0–74.0)
Neutrophils Absolute: 3.7 x10e3/mcL (ref 1.6–7.3)
Platelets: 207 x10e3/mcL (ref 140–440)
RBC: 4.85 x10e6/mcL (ref 4.00–5.60)
RDW: 11.7 % (ref 10.0–17.0)
WBC: 6.5 x10e3/mcL (ref 3.8–10.6)

## 2024-04-24 MED ORDER — SODIUM CHLORIDE 0.9 % IV BOLUS
0.9 | Freq: Once | INTRAVENOUS | Status: AC
Start: 2024-04-24 — End: 2024-04-23
  Administered 2024-04-24: 02:00:00 1000 mL via INTRAVENOUS

## 2024-04-24 MED ORDER — ONDANSETRON 4 MG PO TBDP
4 | ORAL_TABLET | Freq: Three times a day (TID) | ORAL | 0 refills | Status: AC | PRN
Start: 2024-04-24 — End: ?

## 2024-04-24 MED ORDER — ONDANSETRON HCL 4 MG/2ML IJ SOLN
4 | Freq: Once | INTRAMUSCULAR | Status: AC
Start: 2024-04-24 — End: 2024-04-23
  Administered 2024-04-24: 02:00:00 4 mg via INTRAVENOUS

## 2024-04-24 MED FILL — ONDANSETRON HCL 4 MG/2ML IJ SOLN: 4 MG/2ML | INTRAMUSCULAR | Qty: 2

## 2024-04-27 DIAGNOSIS — Z419 Encounter for procedure for purposes other than remedying health state, unspecified: Secondary | ICD-10-CM | POA: Diagnosis not present

## 2024-05-02 ENCOUNTER — Inpatient Hospital Stay
Admit: 2024-05-02 | Discharge: 2024-05-03 | Disposition: A | Discharge status: Home / Self Care | Payer: TRICARE (CHAMPUS) | Arrived: WI | Attending: Emergency Medicine

## 2024-05-02 ENCOUNTER — Emergency Department: Admit: 2024-05-02 | Payer: TRICARE (CHAMPUS)

## 2024-05-02 DIAGNOSIS — K6389 Other specified diseases of intestine: Principal | ICD-10-CM

## 2024-05-02 LAB — COMPREHENSIVE METABOLIC PANEL
ALT: 29 U/L (ref 0–42)
AST: 25 U/L (ref 0–46)
Albumin/Globulin Ratio: 1.8 (ref 1.00–2.70)
Albumin: 4.5 g/dL (ref 3.5–5.2)
Alk Phosphatase: 84 U/L (ref 40–130)
Anion Gap: 12 mmol/L (ref 2–17)
BUN: 13 mg/dL (ref 6–20)
CALCIUM,CORRECTED,CCA: 8.9 mg/dL (ref 8.5–10.7)
CO2: 23 mmol/L (ref 22–29)
Calcium: 9.3 mg/dL (ref 8.5–10.7)
Chloride: 106 mmol/L (ref 98–107)
Creatinine: 1.1 mg/dL (ref 0.7–1.3)
Est, Glom Filt Rate: 96 mL/min/1.73mÂ² (ref >=60)
Globulin: 2.5 g/dL (ref 1.9–4.4)
Glucose: 90 mg/dL (ref 70–99)
Osmolaliy Calculated: 281 mosm/kg (ref 270–287)
Potassium: 4.3 mmol/L (ref 3.5–5.3)
Sodium: 141 mmol/L (ref 135–145)
Total Bilirubin: 0.33 mg/dL (ref 0.00–1.20)
Total Protein: 7 g/dL (ref 5.7–8.3)

## 2024-05-02 LAB — URINALYSIS W/ RFLX MICROSCOPIC
Bilirubin, Urine: NEGATIVE
Blood, Urine: NEGATIVE
Glucose, Ur: NEGATIVE
Ketones, Urine: NEGATIVE
Leukocyte Esterase, Urine: NEGATIVE
Nitrite, Urine: NEGATIVE
Protein, UA: NEGATIVE
Specific Gravity, UA: 1.02 (ref 1.003–1.035)
Urobilinogen, Urine: 0.2 EU/dL (ref 0.2–1.0)
pH, Urine: 6 (ref 4.5–8.0)

## 2024-05-02 LAB — CBC WITH AUTO DIFFERENTIAL
Basophils %: 0.5 % (ref 0.0–2.0)
Basophils Absolute: 0 x10e3/mcL (ref 0.0–0.2)
Eosinophils %: 3.4 % (ref 0.0–7.0)
Eosinophils Absolute: 0.3 x10e3/mcL (ref 0.0–0.5)
Hematocrit: 42.2 % (ref 38.0–52.0)
Hemoglobin: 14.6 g/dL (ref 13.0–17.3)
Immature Grans (Abs): 0.01 x10e3/mcL (ref 0.00–0.06)
Immature Granulocytes %: 0.1 % (ref 0.0–0.6)
Lymphocytes Absolute: 2 x10e3/mcL (ref 1.0–3.2)
Lymphocytes: 26.6 % (ref 15.0–45.0)
MCH: 30.2 pg (ref 27.0–34.5)
MCHC: 34.6 g/dL (ref 30.0–36.0)
MCV: 87.4 fL (ref 84.0–100.0)
MPV: 9.6 fL (ref 7.0–12.2)
Monocytes %: 5.9 % (ref 4.0–12.0)
Monocytes Absolute: 0.4 x10e3/mcL (ref 0.3–1.0)
Neutrophils %: 63.5 % (ref 42.0–74.0)
Neutrophils Absolute: 4.7 x10e3/mcL (ref 1.6–7.3)
Platelets: 222 x10e3/mcL (ref 140–440)
RBC: 4.83 x10e6/mcL (ref 4.00–5.60)
RDW: 12.1 % (ref 10.0–17.0)
WBC: 7.4 x10e3/mcL (ref 3.8–10.6)

## 2024-05-02 MED ORDER — SODIUM CHLORIDE 0.9 % IV BOLUS
0.9 | Freq: Once | INTRAVENOUS | Status: AC
Start: 2024-05-02 — End: 2024-05-02
  Administered 2024-05-02: 23:00:00 1000 mL via INTRAVENOUS

## 2024-05-02 MED ORDER — KETOROLAC TROMETHAMINE 15 MG/ML IJ SOLN
15 | Freq: Once | INTRAMUSCULAR | Status: AC
Start: 2024-05-02 — End: 2024-05-02
  Administered 2024-05-02: 23:00:00 15 mg via INTRAVENOUS

## 2024-05-02 MED ORDER — ONDANSETRON HCL 4 MG/2ML IJ SOLN
4 | Freq: Once | INTRAMUSCULAR | Status: AC
Start: 2024-05-02 — End: 2024-05-02
  Administered 2024-05-02: 23:00:00 4 mg via INTRAVENOUS

## 2024-05-02 MED FILL — KETOROLAC TROMETHAMINE 15 MG/ML IJ SOLN: 15 mg/mL | INTRAMUSCULAR | Qty: 1 | Fill #0

## 2024-05-02 MED FILL — ONDANSETRON HCL 4 MG/2ML IJ SOLN: 4 MG/2ML | INTRAMUSCULAR | Qty: 2 | Fill #0

## 2024-05-02 NOTE — ED Provider Notes (Signed)
 [facility] EMERGENCY DEPARTMENT  EMERGENCY DEPARTMENT ENCOUNTER      Pt Name: Jim Wood  MRN: [id number]  Birthdate Apr 25, 2001  Date of evaluation: 05/02/2024  Provider: Trayquan Kolakowski, DO    CHIEF COMPLAINT       Chief Complaint   Patient presents with    Abdominal Pain     Pt c/o lower abd pain as well as groin pain. Dysuria. Denies n/v/d. No groin swelling per pt         HISTORY OF PRESENT ILLNESS Note limiting factors.   Patient reports that he has had three days of left flank pain radiating to his left testicle and it hurts to urinate. He has passed kidney stone before. Denies fevers, vomiting    The history is provided by the patient. No language interpreter was used.       Nursing Notes were reviewed.  REVIEW OF SYSTEMS       Review of Systems   Respiratory:  Negative for shortness of breath, wheezing and stridor.    Cardiovascular:  Negative for chest pain, palpitations and leg swelling.   Gastrointestinal:  Positive for abdominal pain.   Genitourinary:  Positive for flank pain.     Except as noted above the remainder of the review of systems was reviewed and negative.   PAST MEDICAL HISTORY   No past medical history on file.  SURGICAL HISTORY     No past surgical history on file.  CURRENT MEDICATIONS       Previous Medications    DICYCLOMINE  (BENTYL ) 10 MG CAPSULE    Take 1 capsule by mouth 4 times daily for 7 days    NAPROXEN  SODIUM (ANAPROX  DS) 550 MG TABLET    Take 1 tablet by mouth 2 times daily (with meals) for 7 days    ONDANSETRON  (ZOFRAN -ODT) 4 MG DISINTEGRATING TABLET    Take 1 tablet by mouth every 8 hours as needed for Nausea or Vomiting     ALLERGIES     Patient has no known allergies.  FAMILY HISTORY     No family history on file.     SOCIAL HISTORY       Social History     Socioeconomic History    Marital status: Single     SCREENINGS       Glasgow Coma Scale  Eye Opening: Spontaneous  Best Verbal Response: Oriented  Best Motor Response: Obeys commands  Glasgow Coma Scale  Score: 15             CIWA Assessment  BP: 119/72  Pulse: 68           PHYSICAL EXAM       ED Triage Vitals [05/02/24 1820]   BP Systolic BP Percentile Diastolic BP Percentile Temp Temp Source Pulse Respirations SpO2   132/80 -- -- 98.2 F (36.8 C) Oral 68 20 99 %      Height Weight - Scale         1.753 m (5' 9) 95.3 kg (210 lb)             Physical Exam  Constitutional:       Appearance: He is well-developed.   HENT:      Head: Normocephalic and atraumatic.      Mouth/Throat:      Mouth: Mucous membranes are moist.   Eyes:      Extraocular Movements: Extraocular movements intact.      Pupils: Pupils are equal, round, and  reactive to light.   Cardiovascular:      Rate and Rhythm: Normal rate and regular rhythm.      Heart sounds: Normal heart sounds.   Pulmonary:      Effort: Pulmonary effort is normal.      Breath sounds: Normal breath sounds.   Abdominal:      General: Abdomen is flat.      Palpations: Abdomen is soft.      Tenderness: There is abdominal tenderness in the left lower quadrant. There is left CVA tenderness.      Hernia: No hernia is present.   Neurological:      General: No focal deficit present.      Mental Status: He is alert and oriented to person, place, and time.   Psychiatric:         Mood and Affect: Mood normal.         Behavior: Behavior normal.         DIAGNOSTIC RESULTS     RADIOLOGY:   Non-plain film images such as CT, Ultrasound and MRI are read by the radiologist. Plain radiographic images are visualized and preliminarily interpreted by the emergency physician with the below findings:    Interpretation per the Radiologist below, if available at the time of this note:    US  DUP ABD PEL RETRO SCROT LIMITED   Final Result   Grossly unremarkable scrotal ultrasound. No sonographic evidence for testicular    torsion at this time.      US  SCROTUM AND TESTICLES   Final Result   Grossly unremarkable scrotal ultrasound. No sonographic evidence for testicular    torsion at this time.      CT  ABDOMEN PELVIS WO CONTRAST Additional Contrast? None   Final Result   Edema and fat stranding left lower quadrant adjacent to the sigmoid colon,    favored to represent omental infarct or epiploic appendagitis . There are some    adjacent sigmoid colonic diverticuli and mild early acute sigmoid diverticulitis    is possible although felt to be less likely as there is no significant colonic    bowel wall thickening.          LABS:  Labs Reviewed   CULTURE, URINE   CULTURE, URINE   CBC WITH AUTO DIFFERENTIAL   COMPREHENSIVE METABOLIC PANEL   URINALYSIS W/ RFLX MICROSCOPIC   URINALYSIS W/ RFLX MICROSCOPIC       All other labs were within normal range or not returned as of this dictation.  EMERGENCY DEPARTMENT COURSE and DIFFERENTIAL DIAGNOSIS/MDM:   Vitals:    Vitals:    05/02/24 1820 05/02/24 1912 05/02/24 1913   BP: 132/80 119/72    Pulse: 68     Resp: 20  18   Temp: 98.2 F (36.8 C)     TempSrc: Oral     SpO2: 99% 100% 100%   Weight: 95.3 kg (210 lb)     Height: 1.753 m (5' 9)         Medical Decision Making  Patient feeling better here. Since CT is epipoloic appendigitis vs diverticuli will cover with Abx and refer to GI. He understands plan and willing to follow up      Amount and/or Complexity of Data Reviewed  Labs: ordered. Decision-making details documented in ED Course.  Radiology: ordered and independent interpretation performed. Decision-making details documented in ED Course.    Risk  Prescription drug management.  REASSESSMENT          PROCEDURES:  Unless otherwise noted below, none   Procedures      FINAL IMPRESSION      1. Epiploic appendagitis          DISPOSITION/PLAN   DISPOSITION Decision To Discharge 05/02/2024 08:18:37 PM      PATIENT REFERRED TO:  [facility]  [address]  [address]  [place]  [address]  [phone]  Call today      Terra Tita Ruth, MD  [address] [place] [address]  [phone]    Call in 1  day        DISCHARGE MEDICATIONS:  New Prescriptions    AMOXICILLIN -CLAVULANATE (AUGMENTIN ) 875-125 MG PER TABLET    Take 1 tablet by mouth 2 times daily for 7 days    HYDROCODONE -ACETAMINOPHEN  (NORCO) 5-325 MG PER TABLET    Take 1 tablet by mouth every 4 hours as needed for Pain for up to 3 days. Intended supply: 3 days. Take lowest dose possible to manage pain Max Daily Amount: 6 tablets    METRONIDAZOLE  (FLAGYL ) 500 MG TABLET    Take 1 tablet by mouth in the morning and at bedtime for 7 days    ONDANSETRON  (ZOFRAN ) 4 MG TABLET    Take 1 tablet by mouth 3 times daily as needed for Nausea or Vomiting     (Please note that portions of this note were completed with a voice recognition program.  Efforts were made to edit the dictations but occasionally words are mis-transcribed.)    Makayela Secrest, DO (electronically signed)  Attending Emergency Physician           Annah Asberry LABOR, DO  05/02/24 2021

## 2024-05-02 NOTE — Discharge Instructions (Signed)
 Please see the GI doctor as we discussed. Return sooner for fever, vomiting, worsening pain, or any other new or concerning symptoms!

## 2024-05-03 LAB — CULTURE, URINE: FINAL REPORT: NO GROWTH

## 2024-05-03 MED ORDER — ONDANSETRON HCL 4 MG PO TABS
4 | ORAL_TABLET | Freq: Three times a day (TID) | ORAL | 0 refills | 8.50000 days | Status: AC | PRN
Start: 2024-05-03 — End: ?

## 2024-05-03 MED ORDER — HYDROCODONE-ACETAMINOPHEN 5-325 MG PO TABS
5-325 | ORAL_TABLET | ORAL | 0 refills | Status: AC | PRN
Start: 2024-05-03 — End: 2024-05-05

## 2024-05-03 MED ORDER — AMOXICILLIN-POT CLAVULANATE 875-125 MG PO TABS
875-125 | ORAL_TABLET | Freq: Two times a day (BID) | ORAL | 0 refills | Status: AC
Start: 2024-05-03 — End: 2024-05-09

## 2024-05-03 MED ORDER — METRONIDAZOLE 500 MG PO TABS
500 | ORAL_TABLET | Freq: Two times a day (BID) | ORAL | 0 refills | Status: AC
Start: 2024-05-03 — End: 2024-05-09

## 2024-05-28 DIAGNOSIS — Z419 Encounter for procedure for purposes other than remedying health state, unspecified: Secondary | ICD-10-CM | POA: Diagnosis not present

## 2024-06-28 DIAGNOSIS — Z419 Encounter for procedure for purposes other than remedying health state, unspecified: Secondary | ICD-10-CM | POA: Diagnosis not present
